# Patient Record
Sex: Female | Born: 1955 | Race: White | Hispanic: No | Smoking: Never smoker
Health system: Southern US, Community
[De-identification: ages and names within clinical notes are randomized; demographics above are authoritative.]

## PROBLEM LIST (undated history)

## (undated) DIAGNOSIS — L409 Psoriasis, unspecified: Secondary | ICD-10-CM

---

## 2015-09-19 ENCOUNTER — Emergency Department (INDEPENDENT_AMBULATORY_CARE_PROVIDER_SITE_OTHER): Payer: BLUE CROSS/BLUE SHIELD

## 2015-09-19 ENCOUNTER — Emergency Department
Admission: EM | Admit: 2015-09-19 | Discharge: 2015-09-19 | Disposition: A | Discharge status: Home / Self Care | Payer: BLUE CROSS/BLUE SHIELD | Source: Home / Self Care | Attending: Family Medicine | Admitting: Family Medicine

## 2015-09-19 DIAGNOSIS — R2231 Localized swelling, mass and lump, right upper limb: Secondary | ICD-10-CM | POA: Diagnosis not present

## 2015-09-19 DIAGNOSIS — M25511 Pain in right shoulder: Secondary | ICD-10-CM

## 2015-09-19 HISTORY — DX: Psoriasis, unspecified: L40.9

## 2015-09-19 MED ORDER — PREDNISONE 20 MG PO TABS: ORAL_TABLET | ORAL | Status: AC

## 2015-09-19 MED ORDER — MELOXICAM 7.5 MG PO TABS: 7.5000 mg | ORAL_TABLET | Freq: Every day | ORAL | Status: AC

## 2015-09-19 NOTE — ED Provider Notes (Signed)
CSN: [id number]     Arrival date & time 09/19/15  1621 History   First MD Initiated Contact with Patient 09/19/15 1647     Chief Complaint  Patient presents with  . Shoulder Pain    Right   (Consider location/radiation/quality/duration/timing/severity/associated sxs/prior Treatment) HPI  Pt is a 59yo female [facility] with c/o gradually worsening Right shoulder pain with an associated tender "lump" on the top of her shoulder.  Symptoms started about 10 days ago.  Pt first noticed a tiny bump but states it keeps increasing in size, now to the point it is starting to restrict her ROM in her Right shoulder. Pt reports having rotator cuff repair as well as surgery on her bicep and a bone spur by Dr. Luiz [facility] 18, [facility]. She had been doing well and was released from PT at the end of October, but then noticed this bump.  Denies new injuries. Pain is aching, throbbing, 6/10 at worst, occasionally radiated to her neck and down her arm. Mild intermittent numbness and tingling down her arm. She was unsure if she should f/u with Dr. Luiz BlareGraves or her PCP first. Denies fever, chills, n/v/d. Denies changes in skin color.   Past Medical History  Diagnosis Date  . Psoriasis    History reviewed. No pertinent past surgical history. No family history on file. Social History  Substance Use Topics  . Smoking status: Never Smoker   . Smokeless tobacco: Never Used  . Alcohol Use: No   OB History    No data available     Review of Systems  Musculoskeletal: Positive for myalgias, joint swelling and arthralgias.       Right shoulder  Skin: Negative for color change and wound.  Neurological: Positive for weakness and numbness.       Right arm    Allergies  Review of patient's allergies indicates not on file.  Home Medications   Prior to Admission medications   Medication Sig Start Date End Date Taking? Authorizing Provider  citalopram (CELEXA) 10 MG tablet Take 10 mg by  mouth daily.   Yes Historical Provider, MD  meloxicam (MOBIC) 7.5 MG tablet Take 1 tablet (7.5 mg total) by mouth daily. 09/19/15   Junius FinnerErin O'Malley, PA-C  predniSONE (DELTASONE) 20 MG tablet 3 tabs po day one, then 2 po daily x 4 days 09/19/15   Junius FinnerErin O'Malley, PA-C   Meds Ordered and Administered this Visit  Medications - No data to display  BP 133/89 mmHg  Pulse 95  Temp(Src) 97.8 F (36.6 C) (Oral)  Ht 5\' 3"  (1.6 m)  Wt 211 lb 8 oz (95.936 kg)  BMI 37.48 kg/m2  SpO2 96%  LMP  No data found.   Physical Exam  Constitutional: She is oriented to person, place, and time. She appears well-developed and well-nourished.  HENT:  Head: Normocephalic and atraumatic.  Eyes: EOM are normal.  Neck: Normal range of motion.  Cardiovascular: Normal rate.   Pulses:      Radial pulses are 2+ on the right side.  Pulmonary/Chest: Effort normal.  Musculoskeletal: She exhibits edema and tenderness.  Right shoulder: superior aspect-2cm rounded palpable tender lesion. Limited abduction at extreme of motion.  4/5 strength abduction against resistance compared to Left arm. 5/5 grip strength bilaterally  Full ROM Right elbow, non-tender.   Neurological: She is alert and oriented to person, place, and time.  Right arm: normal sensation   Skin: Skin is warm and dry.  Right arm: skin in tact, no ecchymosis or erythema  Psychiatric: She has a normal mood and affect. Her behavior is normal.  Nursing note and vitals reviewed.   ED Course  Procedures (including critical care time)  Labs Review Labs Reviewed - No data to display  Imaging Review Dg Shoulder Right  09/19/2015  CLINICAL DATA:  Patient complains of a lump over her right shoulder, which has increased in size, with associated increasing discomfort. EXAM: RIGHT SHOULDER - 2+ VIEW COMPARISON:  MRI of the right shoulder dated 11/24/2014 FINDINGS: There is no evidence of fracture or dislocation. There is a 3.2 cm soft tissue thickening within the  subcutaneous soft tissues overlying the acromioclavicular joint, seen on the frontal view. Metallic foreign body overlies the proximal humerus, may be postsurgical. The visualized portions of the lung are normal. IMPRESSION: No evidence of fracture or dislocation. 3.2 cm subcutaneous soft tissue thickening overlies the acromioclavicular joint. If further imaging evaluation is desired, MRI of the shoulder may be considered. Electronically Signed   By: Ted Mcalpine M.D.   On: 09/19/2015 17:47    MDM   1. Right shoulder pain   2. Mass of skin of right shoulder    Pt is a 59yo female, 9 months s/p RTC surgery by Dr. Luiz Blare at [facility].  No new injuries.  Increased pain and decreased ROM with associated "bump" on superior shoulder.  Plain films: 3.2cm subcutaneous soft tissue thickening overlying acromioclavicular joint.   No evidence of underlying infection or bony lesions.  Discussed imaging with pt. Encouraged pt to call Dr. Luiz Blare to schedule f/u for further evaluation of mass.  In meantime, will try trial of Mobic and prednisone to help with pain and swelling.   If unable to f/u with Dr. Luiz Blare by the end of the year, recommend pt call to f/u with Dr. Denyse Amass or Dr. Benjamin Stain, Sports Medicine. Patient verbalized understanding and agreement with treatment plan.     Junius Finner, PA-C 09/21/15 [phone]

## 2015-09-19 NOTE — ED Notes (Signed)
Started about 10 days ago with a large lump on right shoulder that has increased in size over the past 10 days as well as increased in discomfort.  Hard to raise arm and has had increased discomfort in lower back and neck.  Denies numbness and tingling.

## 2015-09-21 ENCOUNTER — Ambulatory Visit (INDEPENDENT_AMBULATORY_CARE_PROVIDER_SITE_OTHER): Payer: BLUE CROSS/BLUE SHIELD | Admitting: Family Medicine

## 2015-09-21 ENCOUNTER — Encounter: Payer: Self-pay | Admitting: Family Medicine

## 2015-09-21 VITALS — BP 162/81 | HR 86 | Wt 211.0 lb

## 2015-09-21 DIAGNOSIS — R2231 Localized swelling, mass and lump, right upper limb: Secondary | ICD-10-CM | POA: Diagnosis not present

## 2015-09-21 MED ORDER — LORAZEPAM 0.5 MG PO TABS: ORAL_TABLET | ORAL | Status: AC

## 2015-09-21 NOTE — Patient Instructions (Signed)
Thank you for coming in today. We will try to schedule an MRI.  We will call with results.  Let me know if you need pain medicine.

## 2015-09-21 NOTE — Progress Notes (Signed)
   Subjective:    I'm seeing this patient as a consultation for:  Junius Finnerrin O'Malley PA-C  CC: Right shoulder pain and mass  HPI: Patient was seen on December third for right shoulder pain. She had a rotator cuff reconstruction with debridement in April 2016. She's had a tiny bump on the superior portion of her shoulder for a few months. It wasn't bothering her very much until about 10 days ago when it rapidly's increased in size and became painful. She notes pain on this. Portion of the shoulder and down her arm to her elbow. Pain is worse with abduction. No radiating pain weakness or numbness fevers or chills. She denies any weight loss nausea vomiting or diarrhea. She gets claustrophobic in MRI scanners.  Past medical history, Surgical history, Family history not pertinant except as noted below, Social history, Allergies, and medications have been entered into the medical record, reviewed, and no changes needed.   Review of Systems: No headache, visual changes, nausea, vomiting, diarrhea, constipation, dizziness, abdominal pain, skin rash, fevers, chills, night sweats, weight loss, swollen lymph nodes, body aches, joint swelling, muscle aches, chest pain, shortness of breath, mood changes, visual or auditory hallucinations.   Objective:    Filed Vitals:   09/21/15 1620  BP: 162/81  Pulse: 86   General: Well Developed, well nourished, and in no acute distress.  Neuro/Psych: Alert and oriented x3, extra-ocular muscles intact, able to move all 4 extremities, sensation grossly intact. Skin: Warm and dry, no rashes noted.  Respiratory: Not using accessory muscles, speaking in full sentences, trachea midline.  Cardiovascular: Pulses palpable, no extremity edema. Abdomen: Does not appear distended. MSK: Right shoulder with mass overlying AC joint. Tender to palpation. No erythema or induration. Significant shoulder pain with range of motion. Pulses capillary refill and sensation are intact  distally.  Limited musculoskeletal ultrasound: Ultrasound probe placed overlying mass. Mostly hypoechoic large 2 x 3 cm mass with some hyperechoic flecks. Some vascular activity seen within the mass. This does not appear to be originating from the [facility] joint.  The supraspinatus tendon was also visualized and abnormal appearing with hypoechoic changes within the distal tendon. No obvious large full thickness tear visible. Increased vascular activity is also present.  No results found for this or any previous visit (from the past 24 hour(s)). Dg Shoulder Right  09/19/2015  CLINICAL DATA:  Patient complains of a lump over her right shoulder, which has increased in size, with associated increasing discomfort. EXAM: RIGHT SHOULDER - 2+ VIEW COMPARISON:  MRI of the right shoulder dated 11/24/2014 FINDINGS: There is no evidence of fracture or dislocation. There is a 3.2 cm soft tissue thickening within the subcutaneous soft tissues overlying the acromioclavicular joint, seen on the frontal view. Metallic foreign body overlies the proximal humerus, may be postsurgical. The visualized portions of the lung are normal. IMPRESSION: No evidence of fracture or dislocation. 3.2 cm subcutaneous soft tissue thickening overlies the acromioclavicular joint. If further imaging evaluation is desired, MRI of the shoulder may be considered. Electronically Signed   By: Ted Mcalpineobrinka  Dimitrova M.D.   On: 09/19/2015 17:47    Impression and Recommendations:   This case required medical decision making of moderate complexity.

## 2015-09-21 NOTE — Assessment & Plan Note (Signed)
Unclear etiology. Obtain MRI of right shoulder with IV contrast. Follow-up following MRI.

## 2015-09-22 ENCOUNTER — Other Ambulatory Visit: Payer: Self-pay | Admitting: Family Medicine

## 2015-09-22 DIAGNOSIS — R2231 Localized swelling, mass and lump, right upper limb: Secondary | ICD-10-CM

## 2015-09-23 ENCOUNTER — Telehealth: Payer: Self-pay | Admitting: Family Medicine

## 2015-09-23 NOTE — Telephone Encounter (Signed)
I received the MRI report from [facility] showing a large 2.4 cm ganglion cyst overlying the AC joint and a small 5mm articular surface tear.  I discussed the results with the patient  I also discussed the patient with the on-call [facility]. He recommends aspiration with possible injection if no infection. Patient will follow-up tomorrow at 2:15 PM

## 2015-09-24 ENCOUNTER — Ambulatory Visit (INDEPENDENT_AMBULATORY_CARE_PROVIDER_SITE_OTHER): Payer: BLUE CROSS/BLUE SHIELD | Admitting: Family Medicine

## 2015-09-24 ENCOUNTER — Encounter: Payer: Self-pay | Admitting: Family Medicine

## 2015-09-24 ENCOUNTER — Telehealth: Payer: Self-pay | Admitting: Family Medicine

## 2015-09-24 VITALS — BP 124/82 | HR 86 | Wt 211.0 lb

## 2015-09-24 DIAGNOSIS — R2231 Localized swelling, mass and lump, right upper limb: Secondary | ICD-10-CM | POA: Diagnosis not present

## 2015-09-24 MED ORDER — HYDROCODONE-ACETAMINOPHEN 5-325 MG PO TABS: 1.0000 | ORAL_TABLET | Freq: Four times a day (QID) | ORAL | Status: AC | PRN

## 2015-09-24 NOTE — Patient Instructions (Addendum)
Thank you for coming in today. Please follow up with Dr. Luiz BlareGraves.  Call me if you get into trouble.  We will call with lab results.  I expect that this cyst will need to be removed.   Call or go to the ER if you develop a large red swollen joint with extreme pain or oozing puss.

## 2015-09-24 NOTE — Assessment & Plan Note (Signed)
Ganglion cyst on MRI aspirated today. Sent for culture and cell count differential crystal analysis. Norco for pain. We'll attempt to schedule a prompt follow-up with patient's orthopedic surgeon Dr. Thurston PoundsBen Graves. Return PRN.

## 2015-09-24 NOTE — Progress Notes (Signed)
Joanne Allen is a 59 y.o. female who [facility] today for follow-up right shoulder mass. Patient was seen 2 days ago for new onset of right shoulder mass. The diagnosis that time was unclear and an IV contrast MRI was obtained showing a large ganglion cyst overlying the right acromioclavicular joint measuring 2.4 x 2.4 cm. Unfortunately she cannot get an appointment with her orthopedic surgeon anatomic manner. She is additionally scheduled to out of town for the next week. She denies any significant radiating pain weakness or numbness. Her pain is worse than little bit. No fevers chills nausea vomiting or diarrhea.   Past Medical History  Diagnosis Date  . Psoriasis    History reviewed. No pertinent past surgical history. Social History  Substance Use Topics  . Smoking status: Never Smoker   . Smokeless tobacco: Never Used  . Alcohol Use: No   family history is not on file.  ROS as above Medications: Current Outpatient Prescriptions  Medication Sig Dispense Refill  . citalopram (CELEXA) 10 MG tablet Take 10 mg by mouth daily.    Marland Kitchen. LORazepam (ATIVAN) 0.5 MG tablet 1-2 tabs 1 hour prior to MRI. Do not drive. 4 tablet 0  . meloxicam (MOBIC) 7.5 MG tablet Take 1 tablet (7.5 mg total) by mouth daily. 30 tablet 0  . predniSONE (DELTASONE) 20 MG tablet 3 tabs po day one, then 2 po daily x 4 days 11 tablet 0  . ustekinumab (STELARA) 45 MG/0.5ML SOSY injection Inject 45 mg [place] day one, then 45 mg [place] week four. Then begin maintenance dose of 45 mg [place] every twelve weeks.    Marland Kitchen. HYDROcodone-acetaminophen (NORCO/VICODIN) 5-325 MG tablet Take 1 tablet by mouth every 6 (six) hours as needed. 15 tablet 0   No current facility-administered medications for this visit.   Not on File   Exam:  BP 124/82 mmHg  Pulse 86  Wt 211 lb (95.709 kg) Gen: Well NAD Right shoulder swollen and tender. No skin erythema or induration. Normal neck and arm  motion.  Aspiration of right acromioclavicular ganglion cyst. The midportion of the cyst was visualized with ultrasound and the skin was marked. The skin was cleaned with alcohol cold spray applied and 1 mL of lidocaine without epinephrine was injected achieving good anesthesia to the skin. The skin was cleaned with chlorhexidine and an 18-gauge needle was used to access the cyst area and 10 mL of bloody synovial fluid was aspirated. Patient felt somewhat better. After 5 minutes she did not have complete resolution of pain. The cyst was still present and partially decompressed. The skin was then cleaned with alcohol again and an 18-gauge needle was used to access the cystic structure and 10 more milliliters of bloody synovial fluid was again aspirated. Patient felt better and a Band-Aid was applied.  No results found for this or any previous visit (from the past 24 hour(s)). No results found.   Please see individual assessment and plan sections.

## 2015-09-24 NOTE — Telephone Encounter (Signed)
I called [place] Office of Dr, Sanjuana LettersBenjamin Graves at Phone [phone] and spoke with Alejandra and scheduled patient for Monday 10-05-15 check in at 2:30 for a 2:45 with Dr.Graves and patient is aware of this appointment time and is good with this time. I will fax notes and images to their office [phone]

## 2015-09-25 LAB — SYNOVIAL CELL COUNT + DIFF, W/ CRYSTALS

## 2015-09-25 NOTE — Progress Notes (Signed)
Quick Note:  Labs show crystals consistent with pseudogout. Awaiting other labs.  Ebony please call [facility] and confirm that they are doing a culture. ______

## 2015-09-28 LAB — BODY FLUID CULTURE
Gram Stain: NONE SEEN
ORGANISM ID, BACTERIA: NO GROWTH

## 2015-09-28 NOTE — Progress Notes (Signed)
Quick Note:  Joint fluid culture is still negative ______

## 2016-04-17 IMAGING — CR DG SHOULDER 2+V*R*
3 series · 3 of 3 positions shown · non-contrast
Comparison: MRI of the right shoulder dated 11/24/2014

CLINICAL DATA: Patient complains of a lump over her right shoulder,
which has increased in size, with associated increasing discomfort.

EXAM:
RIGHT SHOULDER - 2+ VIEW

[shoulder grashey]
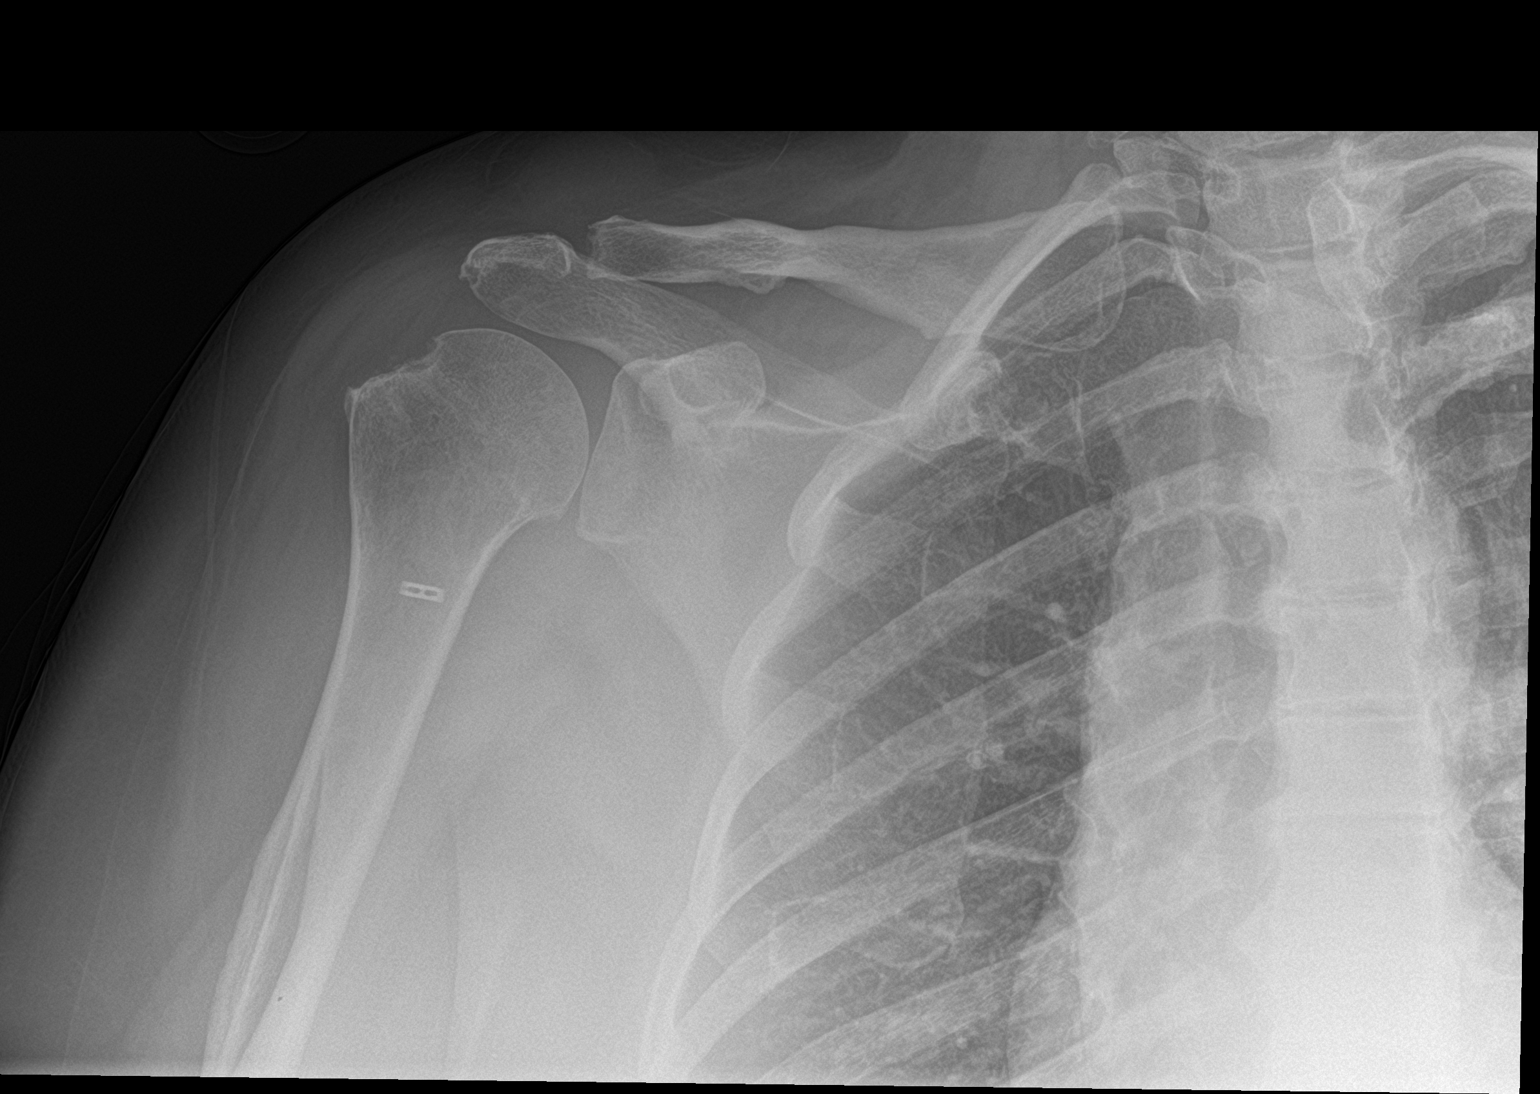

[shoulder y view]
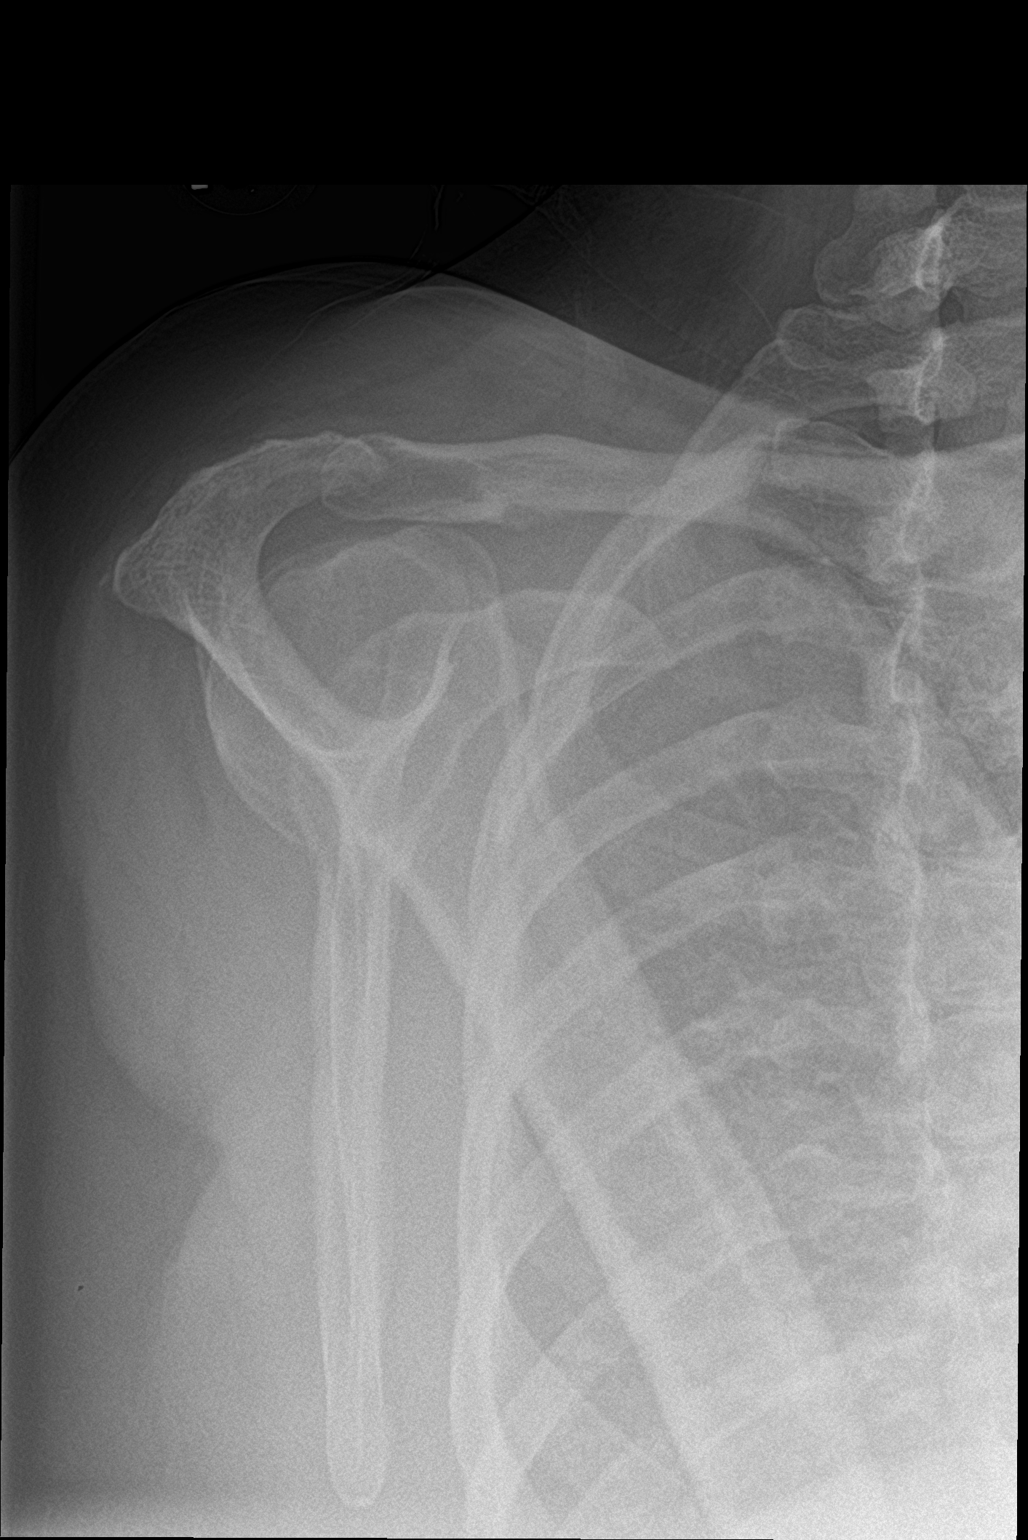

[shoulder axillary]
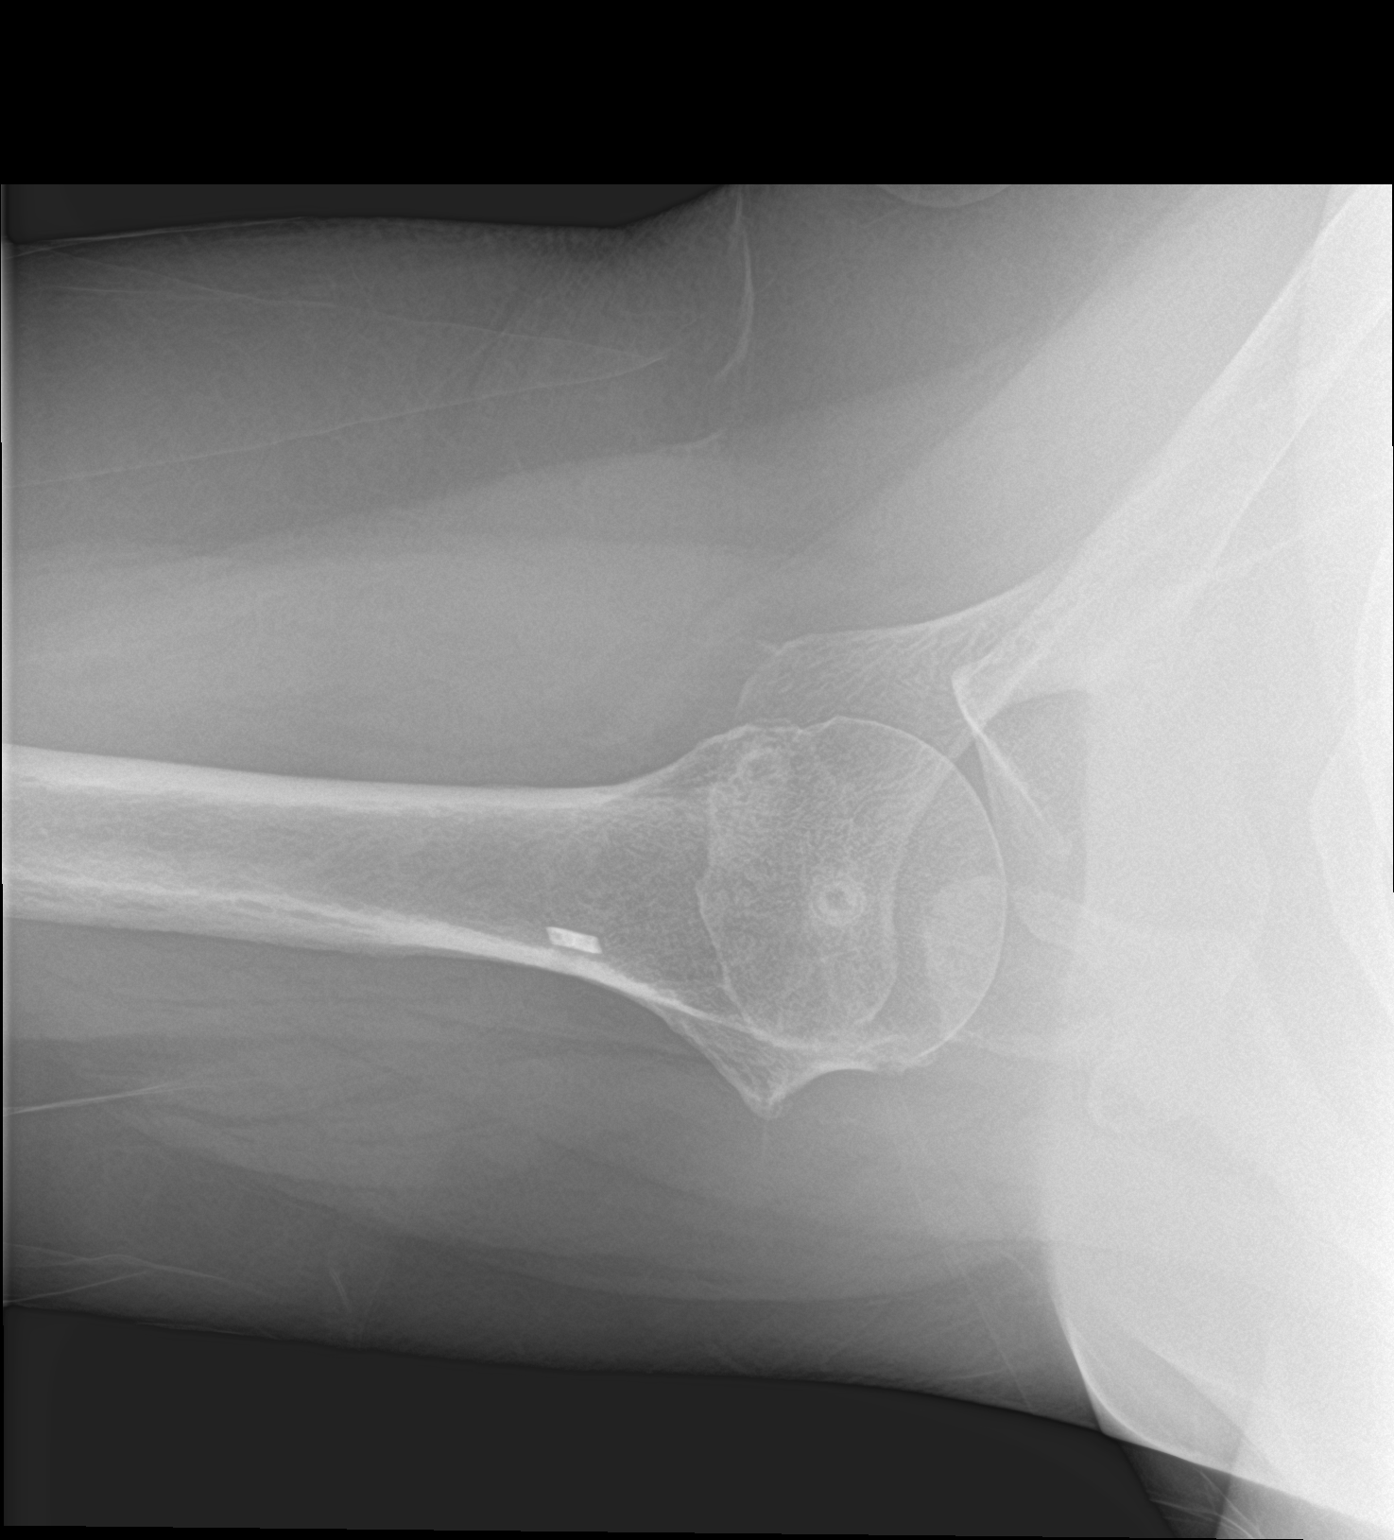

[3 of 3 positions shown; findings below may reference images not displayed]

FINDINGS: There is no evidence of fracture or dislocation. There is a 3.2 cm
soft tissue thickening within the subcutaneous soft tissues
overlying the acromioclavicular joint, seen on the frontal view.
Metallic foreign body overlies the proximal humerus, may be
postsurgical.

The visualized portions of the lung are normal.
IMPRESSION: No evidence of fracture or dislocation.

3.2 cm subcutaneous soft tissue thickening overlies the
acromioclavicular joint. If further imaging evaluation is desired,
MRI of the shoulder may be considered.

## 2023-08-18 DEATH — deceased
